# Patient Record
Sex: Female | Born: 1980 | Race: White | Hispanic: No | Marital: Single | Smoking: Never smoker
Health system: Southern US, Community
[De-identification: ages and names within clinical notes are randomized; demographics above are authoritative.]

## PROBLEM LIST (undated history)

## (undated) DIAGNOSIS — I341 Nonrheumatic mitral (valve) prolapse: Secondary | ICD-10-CM

## (undated) HISTORY — PX: TUBAL LIGATION: SHX77

## (undated) HISTORY — PX: BREAST SURGERY: SHX581

## (undated) HISTORY — PX: BREAST LUMPECTOMY: SHX2

---

## 2012-05-26 ENCOUNTER — Emergency Department: Payer: Self-pay | Admitting: Emergency Medicine

## 2012-05-26 LAB — WET PREP, GENITAL

## 2012-05-26 LAB — URINALYSIS, COMPLETE
Bilirubin,UR: NEGATIVE
Leukocyte Esterase: NEGATIVE
Ph: 5 (ref 4.5–8.0)
Protein: 30
RBC,UR: 11 /HPF (ref 0–5)
Squamous Epithelial: 3

## 2012-05-26 LAB — COMPREHENSIVE METABOLIC PANEL
Albumin: 4.1 g/dL (ref 3.4–5.0)
Alkaline Phosphatase: 81 U/L (ref 50–136)
Anion Gap: 7 (ref 7–16)
BUN: 11 mg/dL (ref 7–18)
Calcium, Total: 9.3 mg/dL (ref 8.5–10.1)
Creatinine: 0.89 mg/dL (ref 0.60–1.30)
EGFR (Non-African Amer.): >60
Glucose: 119 mg/dL — ABNORMAL HIGH (ref 65–99)
Osmolality: 274 (ref 275–301)
SGOT(AST): 13 U/L — ABNORMAL LOW (ref 15–37)
Sodium: 137 mmol/L (ref 136–145)
Total Protein: 8 g/dL (ref 6.4–8.2)

## 2012-05-26 LAB — CBC
HCT: 35.8 % (ref 35.0–47.0)
HGB: 12.4 g/dL (ref 12.0–16.0)
RBC: 4.3 10*6/uL (ref 3.80–5.20)
RDW: 13.7 % (ref 11.5–14.5)
WBC: 9.5 10*3/uL (ref 3.6–11.0)

## 2016-06-06 ENCOUNTER — Emergency Department (HOSPITAL_BASED_OUTPATIENT_CLINIC_OR_DEPARTMENT_OTHER): Payer: Medicaid Other

## 2016-06-06 ENCOUNTER — Emergency Department (HOSPITAL_BASED_OUTPATIENT_CLINIC_OR_DEPARTMENT_OTHER)
Admission: EM | Admit: 2016-06-06 | Discharge: 2016-06-06 | Disposition: A | Discharge status: Home / Self Care | Payer: Medicaid Other | Attending: Emergency Medicine | Admitting: Emergency Medicine

## 2016-06-06 ENCOUNTER — Encounter (HOSPITAL_BASED_OUTPATIENT_CLINIC_OR_DEPARTMENT_OTHER): Payer: Self-pay | Admitting: *Deleted

## 2016-06-06 DIAGNOSIS — Z791 Long term (current) use of non-steroidal anti-inflammatories (NSAID): Secondary | ICD-10-CM | POA: Diagnosis not present

## 2016-06-06 DIAGNOSIS — M5126 Other intervertebral disc displacement, lumbar region: Secondary | ICD-10-CM

## 2016-06-06 DIAGNOSIS — M5106 Intervertebral disc disorders with myelopathy, lumbar region: Secondary | ICD-10-CM | POA: Diagnosis not present

## 2016-06-06 DIAGNOSIS — R200 Anesthesia of skin: Secondary | ICD-10-CM

## 2016-06-06 DIAGNOSIS — M549 Dorsalgia, unspecified: Secondary | ICD-10-CM | POA: Diagnosis present

## 2016-06-06 HISTORY — DX: Nonrheumatic mitral (valve) prolapse: I34.1

## 2016-06-06 MED ORDER — PREDNISONE 20 MG PO TABS
40.0000 mg | ORAL_TABLET | Freq: Once | ORAL | Status: AC
  Administered 2016-06-06: 40 mg via ORAL
  Filled 2016-06-06: qty 2

## 2016-06-06 MED ORDER — METHYLPREDNISOLONE 4 MG PO TBPK: ORAL_TABLET | ORAL | 0 refills | Status: DC

## 2016-06-06 NOTE — ED Notes (Signed)
Pt back from MRI 

## 2016-06-06 NOTE — Discharge Instructions (Signed)
Please call the neurosurgery clinic listed on Monday morning to schedule a follow-up appointment. Return to ER for bowel or bladder incontinence, fever, numbness/tingling to your groin/buttocks region, new or worsening symptoms, any additional concerns.

## 2016-06-06 NOTE — ED Triage Notes (Signed)
Pt c/o lower right back pain which radiates downn right leg with right foot numbness x 2 months ago

## 2016-06-06 NOTE — ED Provider Notes (Signed)
[facility]   CSN: [id number] Arrival date & time: 06/06/16  1149     History   Chief Complaint Chief Complaint  Patient presents with  . Back Pain    HPI Anna Ware is a 35 y.o. female.  The history is provided by the patient and medical records. No language interpreter was used.  Back Pain   Associated symptoms include numbness. Pertinent negatives include no fever, no abdominal pain and no dysuria.   Anna Ware is a 35 y.o. female  with a PMH of MVP who presents to the Emergency Department complaining of worsening right buttocks pain which radiates down her leg to the tips of her toes x 1.5 months. Pain is described as a charley horse sensation to her buttocks and a burning pain down her leg. Right leg will intermittently feel weak or "give out". No medications taken prior to arrival for symptoms. Patient is a Nature conservation officerstocker at [employer] and notices that heavy lifting and working on her feet makes pain worse. Denies upper back or neck pain. Denies fever, saddle anesthesia, urinary complaints including retention/incontinence. No history of cancer, IVDU, or recent spinal procedures.   Past Medical History:  Diagnosis Date  . Mitral valve prolapse     There are no active problems to display for this patient.   Past Surgical History:  Procedure Laterality Date  . BREAST LUMPECTOMY    . BREAST SURGERY    . CESAREAN SECTION    . TUBAL LIGATION      OB History    No data available       Home Medications    Prior to Admission medications   Medication Sig Start Date End Date Taking? Authorizing Provider  ibuprofen (ADVIL,MOTRIN) 600 MG tablet Take 600 mg by mouth every 6 (six) hours as needed.   Yes Historical Provider, MD  methylPREDNISolone (MEDROL DOSEPAK) 4 MG TBPK tablet Take as directed. 06/06/16   Chase PicketJaime Pilcher Ninetta Adelstein, PA-C    Family History No family history on file.  Social History Social History  Substance Use Topics  .  Smoking status: Never Smoker  . Smokeless tobacco: Not on file  . Alcohol use No     Allergies   Review of patient's allergies indicates no known allergies.   Review of Systems Review of Systems  Constitutional: Negative for fever.  HENT: Negative for congestion.   Eyes: Negative for visual disturbance.  Respiratory: Negative for cough and shortness of breath.   Cardiovascular: Negative.   Gastrointestinal: Negative for abdominal pain, nausea and vomiting.  Genitourinary: Negative for dysuria.  Musculoskeletal: Positive for arthralgias and myalgias. Negative for neck pain.  Skin: Negative for color change, rash and wound.  Neurological: Positive for numbness.     Physical Exam Updated Vital Signs BP 140/82 (BP Location: Right Arm)   Pulse 68   Temp 98.2 F (36.8 C) (Oral)   Resp 18   Ht 5\' 7"  (1.702 m)   Wt 101.6 kg   LMP 05/29/2016   SpO2 100%   BMI 35.08 kg/m   Physical Exam  Constitutional: She is oriented to person, place, and time. She appears well-developed and well-nourished.  Neck:  Full ROM without pain. No midline or paraspinal tenderness.  Cardiovascular: Normal rate, regular rhythm, normal heart sounds and intact distal pulses.   No murmur heard. Pulmonary/Chest: Effort normal and breath sounds normal. No respiratory distress. She has no wheezes.  Abdominal: Soft. She exhibits no distension. There is no tenderness.  Musculoskeletal:  No midline tenderness to palpation.  Straight leg raises are positive on right for radicular symptoms. 5/5 muscle strength of bilateral LE's. Able to ambulate and perform toe and heel raises.  Neurological: She is alert and oriented to person, place, and time. She has normal reflexes.  Decreased sensation of RLE in S1 nerve distribution.   Skin: Skin is warm and dry. No rash noted. No erythema.  Nursing note and vitals reviewed.    ED Treatments / Results  Labs (all labs ordered are listed, but only abnormal results  are displayed) Labs Reviewed - No data to display  EKG  EKG Interpretation None       Radiology Mr Lumbar Spine Wo Contrast  Result Date: 06/06/2016 CLINICAL DATA:  Right leg numbness. EXAM: MRI LUMBAR SPINE WITHOUT CONTRAST TECHNIQUE: Multiplanar, multisequence MR imaging of the lumbar spine was performed. No intravenous contrast was administered. COMPARISON:  None. FINDINGS: Segmentation:  Standard. Alignment:  Slight retrolisthesis at L5-S1. Vertebrae: No fracture, evidence of discitis, or bone lesion. Mild degenerative fatty marrow conversion around the L5-S1 disc and the T12 superior endplate Schmorl's node Conus medullaris: Extends to the L1 level and appears normal. Paraspinal and other soft tissues: Negative Disc levels: L4-5: Mild left foraminal disc bulging and endplate ridging but no compression. L5-S1: Disc narrowing and desiccation with right paracentral protrusion compressing the S1 nerve. IMPRESSION: L5-S1 right paracentral disc protrusion with S1 compression. Electronically Signed   By: Marnee Spring M.D.   On: 06/06/2016 15:10    Procedures Procedures (including critical care time)  Medications Ordered in ED Medications  predniSONE (DELTASONE) tablet 40 mg (40 mg Oral Given 06/06/16 1308)     Initial Impression / Assessment and Plan / ED Course  I have reviewed the triage vital signs and the nursing notes.  Pertinent labs & imaging results that were available during my care of the patient were reviewed by me and considered in my medical decision making (see chart for details).  Clinical Course   Anna Ware is a 35 y.o. female who presents to ED for Right buttock pain which radiates down her leg. Symptoms consistent with nerve compression. On exam, she does exhibit decreased sensation to the right lower extremity along the S1 nerve distribution pattern. No fevers or other infectious symptoms to suggest that the patient's back pain is due to an infection. No  saddle anesthesia or bowel/bladder incontinence. Patient does not have primary care provider. Given lack of PCP follow-up along with new onset of decreased S1 sensation, radicular pain, positive right leg straight leg raise, MRI obtained in the ED today which shows L5-S1 right paracentral disc protrusion with S1 compression. Discussed findings with patient as well as neurosurgery follow-up recommendations and home care instructions. Reasons to return to ED discussed and all questions answered.   Final Clinical Impressions(s) / ED Diagnoses   Final diagnoses:  Right leg numbness  Protruded lumbar disc    New Prescriptions New Prescriptions   METHYLPREDNISOLONE (MEDROL DOSEPAK) 4 MG TBPK TABLET    Take as directed.     [facility] Bonser, PA-C 06/06/16 1552    Vanetta Mulders, MD 06/07/16 [phone]

## 2016-06-06 NOTE — ED Notes (Signed)
Pt in radiology 

## 2016-07-12 ENCOUNTER — Encounter (HOSPITAL_BASED_OUTPATIENT_CLINIC_OR_DEPARTMENT_OTHER): Payer: Self-pay | Admitting: *Deleted

## 2016-07-12 ENCOUNTER — Emergency Department (HOSPITAL_BASED_OUTPATIENT_CLINIC_OR_DEPARTMENT_OTHER)
Admission: EM | Admit: 2016-07-12 | Discharge: 2016-07-12 | Disposition: A | Discharge status: Home / Self Care | Payer: Medicaid Other | Attending: Emergency Medicine | Admitting: Emergency Medicine

## 2016-07-12 DIAGNOSIS — M5417 Radiculopathy, lumbosacral region: Secondary | ICD-10-CM

## 2016-07-12 DIAGNOSIS — M5126 Other intervertebral disc displacement, lumbar region: Secondary | ICD-10-CM | POA: Insufficient documentation

## 2016-07-12 DIAGNOSIS — M545 Low back pain: Secondary | ICD-10-CM | POA: Diagnosis present

## 2016-07-12 MED ORDER — HYDROMORPHONE HCL 1 MG/ML IJ SOLN
2.0000 mg | Freq: Once | INTRAMUSCULAR | Status: AC
  Administered 2016-07-12: 2 mg via INTRAMUSCULAR
  Filled 2016-07-12: qty 2

## 2016-07-12 MED ORDER — OXYCODONE-ACETAMINOPHEN 5-325 MG PO TABS: 1.0000 | ORAL_TABLET | ORAL | 0 refills | Status: DC | PRN

## 2016-07-12 MED ORDER — ONDANSETRON 8 MG PO TBDP
8.0000 mg | ORAL_TABLET | Freq: Once | ORAL | Status: AC
  Administered 2016-07-12: 8 mg via ORAL
  Filled 2016-07-12: qty 1

## 2016-07-12 NOTE — ED Provider Notes (Addendum)
[facility] Provider Note: Anna DellJ. Lane Anabelle Bungert, MD, FACEP  CSN: [id number] MRN: [id number] ARRIVAL: 07/12/16 at 0458 ROOM: [facility]10/[facility]10   CHIEF COMPLAINT  Leg Pain   HISTORY OF PRESENT ILLNESS  Anna Ware is a 35 y.o. female right-sided sciatic pain and paresthesias in the S1 distribution for over 2 months. She was seen on October 14 and had an MRI which showed a herniated L5/S1 disc with compression of S1. She was not able to follow-up with neurosurgery due to financial limitations. She returns with an exacerbation of her pain for the past 5 days. It is still localized to the right S1 nerve distribution, worse with movement. The pain varies from sharp to burning. She also has paresthesias in the S1 distribution which come and go. She is not presently having paresthesias. She denies any change in bowel or bladder function. She is able to bear weight but it is painful to ambulate. She describes it as severe and limits her ability to perform activities of daily living.    Past Medical History:  Diagnosis Date  . Mitral valve prolapse     Past Surgical History:  Procedure Laterality Date  . BREAST LUMPECTOMY    . BREAST SURGERY    . CESAREAN SECTION    . TUBAL LIGATION      No family history on file.  Social History  Substance Use Topics  . Smoking status: Never Smoker  . Smokeless tobacco: Never Used  . Alcohol use No    Prior to Admission medications   Not on File    Allergies Patient has no known allergies.   REVIEW OF SYSTEMS  Negative except as noted here or in the History of Present Illness.   PHYSICAL EXAMINATION  Initial Vital Signs Blood pressure 136/96, pulse 109, temperature 98.3 F (36.8 C), resp. rate 20, height 5\' 7"  (1.702 m), weight 220 lb (99.8 kg), last menstrual period 06/20/2016, SpO2 99 %.  Examination General: Well-developed, well-nourished female in no acute distress; appearance consistent with age of record HENT: normocephalic;  atraumatic Eyes: Normal appearance Neck: supple Heart: regular rate and rhythm Lungs: clear to auscultation bilaterally Abdomen: soft; nondistended; nontender Back: Tender right sciatic notch with pain on attempted movement of the right hip Extremities: No deformity; limited range of motion of right hip due to pain; pulses normal Neurologic: Awake, alert and oriented; motor function grossly intact in all extremities but examination is limited in the lower extremities due to patient's pain and reduced range of motion; sensation intact in lower extremities and symmetric; no facial droop Skin: Warm and dry Psychiatric: Normal mood and affect   RESULTS  Summary of this visit's results, reviewed by myself:   EKG Interpretation  Date/Time:    Ventricular Rate:    PR Interval:    QRS Duration:   QT Interval:    QTC Calculation:   R Axis:     Text Interpretation:        Laboratory Studies: No results found for this or any previous visit (from the past 24 hour(s)). Imaging Studies: No results found.  ED COURSE  Nursing notes and initial vitals signs, including pulse oximetry, reviewed.  Vitals:   07/12/16 0514 07/12/16 0515  BP:  136/96  Pulse:  109  Resp:  20  Temp:  98.3 F (36.8 C)  SpO2:  99%  Weight: 220 lb (99.8 kg)   Height: 5\' 7"  (1.702 m)    6:01 AM We will re-refer the patient to [facility].  She was advised to remind them of their obligations under EMTALA to see her without demanding payment upfront. We will also refer to Dr. Pearletha ForgeHudnall of sports medicine.  PROCEDURES    ED DIAGNOSES     ICD-9-CM ICD-10-CM   1. Lumbosacral radiculopathy at S1 724.4 M54.17   2. Lumbar herniated disc 722.10 M51.26        Paula LibraJohn Daquavion Catala, MD 07/12/16 [id number]    Paula LibraJohn Daimian Sudberry, MD 07/12/16 2244

## 2016-07-12 NOTE — ED Triage Notes (Signed)
Pt states that she was seen in the ED in Oct for a herniated disc. Was not  able to follow up with a neuro MD due to cost. States pain has been ongoing since Oct. States last 5 days have been worse. Pain is located in her right leg. Describes as a sharp and feels like she is "being shocked" in her leg. States pain is constant and pt is able to walk but painful. Able to control her bladder and bowels. C/o numbness and tingling to her right leg.

## 2016-07-15 ENCOUNTER — Ambulatory Visit (INDEPENDENT_AMBULATORY_CARE_PROVIDER_SITE_OTHER): Payer: Self-pay | Admitting: Family Medicine

## 2016-07-15 ENCOUNTER — Encounter: Payer: Self-pay | Admitting: Family Medicine

## 2016-07-15 DIAGNOSIS — M5416 Radiculopathy, lumbar region: Secondary | ICD-10-CM

## 2016-07-15 MED ORDER — OXYCODONE-ACETAMINOPHEN 7.5-325 MG PO TABS: 1.0000 | ORAL_TABLET | Freq: Four times a day (QID) | ORAL | 0 refills | Status: DC | PRN

## 2016-07-15 MED ORDER — PREDNISONE 10 MG PO TABS: ORAL_TABLET | ORAL | 0 refills | Status: DC

## 2016-07-15 MED ORDER — CYCLOBENZAPRINE HCL 10 MG PO TABS: 10.0000 mg | ORAL_TABLET | Freq: Every evening | ORAL | 1 refills | Status: DC | PRN

## 2016-07-15 NOTE — Patient Instructions (Signed)
You have lumbar radiculopathy (a pinched nerve in your low back). A prednisone dose pack is the best option for immediate relief and may be prescribed. Day after finishing prednisone start aleve 2 tabs twice a day with food for pain and inflammation. Percocet as needed for severe pain (no driving on this medicine). Flexeril as needed for muscle spasms (no driving on this medicine if it makes you sleepy). Strengthening of low back muscles, abdominal musculature are key for long term pain relief. Call us when medicaid goes through so we can go ahead with injections and/or neurosurgery referral.

## 2016-07-20 ENCOUNTER — Telehealth: Payer: Self-pay | Admitting: Family Medicine

## 2016-07-20 DIAGNOSIS — M5416 Radiculopathy, lumbar region: Secondary | ICD-10-CM | POA: Insufficient documentation

## 2016-07-20 NOTE — Telephone Encounter (Signed)
It's possible the prednisone is causing the increased swelling but that is not dangerous.  Recommendation is to elevate, use ACE wrap for compression, icing if needed.

## 2016-07-20 NOTE — Progress Notes (Signed)
PCP: No PCP Per Patient  Subjective:   HPI: Patient is a 35 y.o. female here for right leg/hip pain.  Patient reports she's had 5 months of right sided leg pain from posterior hip down into toes. Associated with numbness in lateral two toes. Has tried percocet, medrol dose pack. Pain is 10/10 sharp. No known injury or trauma. No skin changes. No bowel/bladder dysfunction.  Past Medical History:  Diagnosis Date  . Mitral valve prolapse     No current outpatient prescriptions on file prior to visit.   No current facility-administered medications on file prior to visit.     Past Surgical History:  Procedure Laterality Date  . BREAST LUMPECTOMY    . BREAST SURGERY    . CESAREAN SECTION    . TUBAL LIGATION      No Known Allergies  Social History   Social History  . Marital status: Single    Spouse name: N/A  . Number of children: N/A  . Years of education: N/A   Occupational History  . Not on file.   Social History Main Topics  . Smoking status: Never Smoker  . Smokeless tobacco: Never Used  . Alcohol use No  . Drug use: Unknown  . Sexual activity: No   Other Topics Concern  . Not on file   Social History Narrative  . No narrative on file    No family history on file.  BP 125/78   Pulse 80   Ht 5\' 7"  (1.702 m)   Wt 220 lb (99.8 kg)   LMP 06/20/2016 (Approximate)   BMI 34.46 kg/m   Review of Systems: See HPI above.     Objective:  Physical Exam:  Gen: NAD, comfortable in exam room  Back: No gross deformity, scoliosis. No paraspinal TTP.  No midline or bony TTP. FROM with pain on flexion. Strength LEs 5/5 all muscle groups.   2+ MSRs in patellar and achilles tendons, equal bilaterally. Negative SLRs. Sensation intact to light touch bilaterally. Negative logroll bilateral hips   Assessment & Plan:  1. Lumbar radiculopathy - with radiation into right leg and 4th, 5th digit numbness.  She will try extended prednisone dose pack.   Unfortunately she is waiting on Medicaid as next step would be to go ahead with imaging of lumbar spine, consider ESIs vs neurosurgery referral.  Percocet as needed for severe pain.  Call us when Medicaid goes through.

## 2016-07-20 NOTE — Assessment & Plan Note (Signed)
with radiation into right leg and 4th, 5th digit numbness.  She will try extended prednisone dose pack.  Unfortunately she is waiting on Medicaid as next step would be to go ahead with imaging of lumbar spine, consider ESIs vs neurosurgery referral.  Percocet as needed for severe pain.  Call us when Medicaid goes through.

## 2016-07-21 NOTE — Telephone Encounter (Signed)
Spoke to patient and gave her information provided by physician. 

## 2017-02-28 ENCOUNTER — Encounter (HOSPITAL_BASED_OUTPATIENT_CLINIC_OR_DEPARTMENT_OTHER): Payer: Self-pay | Admitting: Emergency Medicine

## 2017-02-28 ENCOUNTER — Emergency Department (HOSPITAL_BASED_OUTPATIENT_CLINIC_OR_DEPARTMENT_OTHER)
Admission: EM | Admit: 2017-02-28 | Discharge: 2017-02-28 | Disposition: A | Discharge status: Home / Self Care | Payer: Medicaid Other | Attending: Emergency Medicine | Admitting: Emergency Medicine

## 2017-02-28 DIAGNOSIS — M545 Low back pain: Secondary | ICD-10-CM | POA: Diagnosis present

## 2017-02-28 DIAGNOSIS — M5431 Sciatica, right side: Secondary | ICD-10-CM

## 2017-02-28 DIAGNOSIS — M5416 Radiculopathy, lumbar region: Secondary | ICD-10-CM | POA: Diagnosis not present

## 2017-02-28 MED ORDER — KETOROLAC TROMETHAMINE 60 MG/2ML IM SOLN
60.0000 mg | Freq: Once | INTRAMUSCULAR | Status: AC
  Administered 2017-02-28: 60 mg via INTRAMUSCULAR
  Filled 2017-02-28: qty 2

## 2017-02-28 MED ORDER — OXYCODONE-ACETAMINOPHEN 5-325 MG PO TABS: 1.0000 | ORAL_TABLET | Freq: Four times a day (QID) | ORAL | 0 refills | Status: DC | PRN

## 2017-02-28 MED ORDER — PREDNISONE 10 MG (21) PO TBPK: ORAL_TABLET | Freq: Every day | ORAL | 0 refills | Status: DC

## 2017-02-28 MED ORDER — METHOCARBAMOL 500 MG PO TABS: 500.0000 mg | ORAL_TABLET | Freq: Two times a day (BID) | ORAL | 0 refills | Status: DC

## 2017-02-28 MED ORDER — NAPROXEN 500 MG PO TABS: 500.0000 mg | ORAL_TABLET | Freq: Two times a day (BID) | ORAL | 0 refills | Status: DC

## 2017-02-28 MED ORDER — HYDROMORPHONE HCL 1 MG/ML IJ SOLN
2.0000 mg | Freq: Once | INTRAMUSCULAR | Status: AC
  Administered 2017-02-28: 2 mg via INTRAMUSCULAR
  Filled 2017-02-28: qty 2

## 2017-02-28 NOTE — Discharge Instructions (Signed)
Return to the ED with any concerns including weakness of legs, not able to urinate, loss of control of bowel or bladder, decreased level of alertness/lethargy, or any other alarming symptoms °

## 2017-02-28 NOTE — ED Provider Notes (Signed)
[facility]   CSN: [id number] Arrival date & time: 02/28/17  0915     History   Chief Complaint Chief Complaint  Patient presents with  . Back Pain    HPI Anna Ware is a 36 y.o. female.  HPI  Pt presenting with c/o right sided low back pain.  She has hx of sciatica and herinated disc.  She states that after treatment with prednisone for last flare-up her symptoms have not bothered her much, she has not yet seen a neurosurgeon.  Current pain in right low back has been presenting for the past week- radiates down past right knee.  No weakness of leg, no urinary retention, no loss of control or bowel or bladder.  No fever/chills.  She has not had any injury.  There are no other associated systemic symptoms, there are no other alleviating or modifying factors.   Past Medical History:  Diagnosis Date  . Mitral valve prolapse     Patient Active Problem List   Diagnosis Date Noted  . Lumbar radiculopathy 07/20/2016    Past Surgical History:  Procedure Laterality Date  . BREAST LUMPECTOMY    . BREAST SURGERY    . CESAREAN SECTION    . TUBAL LIGATION      OB History    No data available       Home Medications    Prior to Admission medications   Medication Sig Start Date End Date Taking? Authorizing Provider  cyclobenzaprine (FLEXERIL) 10 MG tablet Take 1 tablet (10 mg total) by mouth at bedtime as needed for muscle spasms. 07/15/16   Hudnall, Azucena Fallen, MD  methocarbamol (ROBAXIN) 500 MG tablet Take 1 tablet (500 mg total) by mouth 2 (two) times daily. 02/28/17   Jerelyn Scott, MD  naproxen (NAPROSYN) 500 MG tablet Take 1 tablet (500 mg total) by mouth 2 (two) times daily. 02/28/17   Jerelyn Scott, MD  oxyCODONE-acetaminophen (PERCOCET/ROXICET) 5-325 MG tablet Take 1-2 tablets by mouth every 6 (six) hours as needed for severe pain. 02/28/17   Jerelyn Scott, MD  predniSONE (STERAPRED UNI-PAK 21 TAB) 10 MG (21) TBPK tablet Take by mouth daily. Take  6 tabs by mouth daily  for 2 days, then 5 tabs for 2 days, then 4 tabs for 2 days, then 3 tabs for 2 days, 2 tabs for 2 days, then 1 tab by mouth daily for 2 days 02/28/17   Jerelyn Scott, MD    Family History History reviewed. No pertinent family history.  Social History Social History  Substance Use Topics  . Smoking status: Never Smoker  . Smokeless tobacco: Never Used  . Alcohol use No     Allergies   Patient has no known allergies.   Review of Systems Review of Systems  ROS reviewed and all otherwise negative except for mentioned in HPI   Physical Exam Updated Vital Signs BP 117/74 (BP Location: Right Arm)   Pulse 85   Temp 97.8 F (36.6 C) (Oral)   Resp 18   Ht 5\' 7"  (1.702 m)   Wt 99.8 kg (220 lb)   LMP 02/10/2017   SpO2 100%   BMI 34.46 kg/m  Vitals reviewed Physical Exam Physical Examination: General appearance - alert, well appearing, and in no distress Mental status - alert, oriented to person, place, and time Eyes - no conjunctival injection, no scleral icterus Chest - clear to auscultation, no wheezes, rales or rhonchi, symmetric air entry Heart - normal rate, regular rhythm,  normal S1, S2, no murmurs, rubs, clicks or gallops Back exam - no midline tenderness to palpation midline spine, right lower paraspinal tenderness to palpation, no CVA tenderness Neurological - alert, oriented, normal speech, strength 5/5 in lower extrmeties- movement limited by pain, sensation intact, normal DTRs of knee and ankle Musculoskeletal - no joint tenderness, deformity or swelling Extremities - peripheral pulses normal, no pedal edema, no clubbing or cyanosis Skin - normal coloration and turgor, no rashes  ED Treatments / Results  Labs (all labs ordered are listed, but only abnormal results are displayed) Labs Reviewed - No data to display  EKG  EKG Interpretation None       Radiology No results found.  Procedures Procedures (including critical care  time)  Medications Ordered in ED Medications  HYDROmorphone (DILAUDID) injection 2 mg (2 mg Intramuscular Given 02/28/17 0959)  ketorolac (TORADOL) injection 60 mg (60 mg Intramuscular Given 02/28/17 0958)     Initial Impression / Assessment and Plan / ED Course  I have reviewed the triage vital signs and the nursing notes.  Pertinent labs & imaging results that were available during my care of the patient were reviewed by me and considered in my medical decision making (see chart for details).    11:07 AM pt feeling much improved after meds.    Pt presenting with c/o right sided low back pain with raidation down right leg.  No injury, no midline pain.  symtoms are most c/w sciatica due to herniated disc.  Pt feels much improved after meds in the ED.  Pt discharge with po meds, advised f/u with neurosurgery.  Discharged with strict return precautions.  Pt agreeable with plan.  Final Clinical Impressions(s) / ED Diagnoses   Final diagnoses:  Sciatica, right side    New Prescriptions Discharge Medication List as of 02/28/2017 11:12 AM    START taking these medications   Details  methocarbamol (ROBAXIN) 500 MG tablet Take 1 tablet (500 mg total) by mouth 2 (two) times daily., Starting Sun 02/28/2017, Print    naproxen (NAPROSYN) 500 MG tablet Take 1 tablet (500 mg total) by mouth 2 (two) times daily., Starting Sun 02/28/2017, Print    oxyCODONE-acetaminophen (PERCOCET/ROXICET) 5-325 MG tablet Take 1-2 tablets by mouth every 6 (six) hours as needed for severe pain., Starting Sun 02/28/2017, Print    predniSONE (STERAPRED UNI-PAK 21 TAB) 10 MG (21) TBPK tablet Take by mouth daily. Take 6 tabs by mouth daily  for 2 days, then 5 tabs for 2 days, then 4 tabs for 2 days, then 3 tabs for 2 days, 2 tabs for 2 days, then 1 tab by mouth daily for 2 days, Starting Sun 02/28/2017, Print         Jerelyn ScottLinker, Rhetta Cleek, MD 03/04/17 [phone]

## 2017-02-28 NOTE — ED Triage Notes (Signed)
Patient states that about a year ago the she was found to have a herniated disk. Was treated by us, and then Dr. Herbert MoorsHudnal. The patient reports that she has been fine for the last year and then last week she started to have the pain again and has been in bed x 1 week with the pain.

## 2017-03-05 ENCOUNTER — Ambulatory Visit (INDEPENDENT_AMBULATORY_CARE_PROVIDER_SITE_OTHER): Payer: Medicaid Other | Admitting: Family Medicine

## 2017-03-05 ENCOUNTER — Encounter: Payer: Self-pay | Admitting: Family Medicine

## 2017-03-05 DIAGNOSIS — M5416 Radiculopathy, lumbar region: Secondary | ICD-10-CM | POA: Diagnosis present

## 2017-03-05 MED ORDER — OXYCODONE-ACETAMINOPHEN 5-325 MG PO TABS: 1.0000 | ORAL_TABLET | Freq: Four times a day (QID) | ORAL | 0 refills | Status: DC | PRN

## 2017-03-05 NOTE — Assessment & Plan Note (Signed)
with radiation into right leg and lateral lower leg numbness.  She will continue prednisone dose pack.  Robaxin or flexeril as needed for spasms.  Percocet for severe pain.  She will call us in a week to let us know how she's doing.  Consider repeating MRI or ESI if not improving as expected.

## 2017-03-05 NOTE — Progress Notes (Signed)
PCP: Patient, No Pcp Per  Subjective:   HPI: Patient is a 36 y.o. female here for right leg/hip pain.  07/15/16: Patient reports she's had 5 months of right sided leg pain from posterior hip down into toes. Associated with numbness in lateral two toes. Has tried percocet, medrol dose pack. Pain is 10/10 sharp. No known injury or trauma. No skin changes. No bowel/bladder dysfunction.  03/05/17: Patient reports she had been doing well until about 2 weeks ago. No acute injury or trauma. Just started to get pain posterior right low back and buttock. Progressed to include right leg into calf and ankle. Feels like a tightness down there but overall leg feels like it's on fire with burning sensation and numbness. Tried prednisone dose pack, robaxin, naproxen, percocet. Prednisone has helped some and percocet helps with pain. Still with 7/10 level pain, sharp. Worse in all motions except lying down on stomach. No bowel/bladder dysfunction. No skin changes.  Past Medical History:  Diagnosis Date  . Mitral valve prolapse     Current Outpatient Prescriptions on File Prior to Visit  Medication Sig Dispense Refill  . cyclobenzaprine (FLEXERIL) 10 MG tablet Take 1 tablet (10 mg total) by mouth at bedtime as needed for muscle spasms. 60 tablet 1  . methocarbamol (ROBAXIN) 500 MG tablet Take 1 tablet (500 mg total) by mouth 2 (two) times daily. 20 tablet 0  . naproxen (NAPROSYN) 500 MG tablet Take 1 tablet (500 mg total) by mouth 2 (two) times daily. 30 tablet 0  . predniSONE (STERAPRED UNI-PAK 21 TAB) 10 MG (21) TBPK tablet Take by mouth daily. Take 6 tabs by mouth daily  for 2 days, then 5 tabs for 2 days, then 4 tabs for 2 days, then 3 tabs for 2 days, 2 tabs for 2 days, then 1 tab by mouth daily for 2 days 42 tablet 0   No current facility-administered medications on file prior to visit.     Past Surgical History:  Procedure Laterality Date  . BREAST LUMPECTOMY    . BREAST SURGERY     . CESAREAN SECTION    . TUBAL LIGATION      No Known Allergies  Social History   Social History  . Marital status: Single    Spouse name: N/A  . Number of children: N/A  . Years of education: N/A   Occupational History  . Not on file.   Social History Main Topics  . Smoking status: Never Smoker  . Smokeless tobacco: Never Used  . Alcohol use No  . Drug use: Unknown  . Sexual activity: No   Other Topics Concern  . Not on file   Social History Narrative  . No narrative on file    No family history on file.  BP 111/75   Pulse 67   Ht 5\' 7"  (1.702 m)   Wt 220 lb (99.8 kg)   LMP 02/10/2017   BMI 34.46 kg/m   Review of Systems: See HPI above.     Objective:  Physical Exam:  Gen: NAD, comfortable in exam room  Back: No gross deformity, scoliosis. No paraspinal TTP.  No midline or bony TTP. Flexion to 45 degrees, painful.  No pain other motions. Strength LEs 5/5 all muscle groups.   2+ MSRs in patellar and achilles tendons, equal bilaterally. Positive right SLR, negative left. Sensation diminished left lower leg laterally, otherwise intact. Negative logroll bilateral hips   Assessment & Plan:  1. Lumbar radiculopathy - with radiation  into right leg and lateral lower leg numbness.  She will continue prednisone dose pack.  Robaxin or flexeril as needed for spasms.  Percocet for severe pain.  She will call us in a week to let us know how she's doing.  Consider repeating MRI or ESI if not improving as expected.

## 2017-03-05 NOTE — Patient Instructions (Addendum)
You have lumbar radiculopathy (a pinched nerve in your low back). Finish the prednisone as directed. Day after finishing prednisone you could then start the naproxen twice a day with food for pain and inflammation. Percocet as needed for severe pain (no driving on this medicine). Robaxin as needed for muscle spasms (no driving on this medicine if it makes you sleepy). Strengthening of low back muscles, abdominal musculature are key for long term pain relief. Consider repeating MRI or trial of an injection where you had the irritated nerve back in November if not improving. Call us next week to let us know how you're doing.

## 2017-04-15 ENCOUNTER — Encounter: Payer: Self-pay | Admitting: Family Medicine

## 2017-04-15 ENCOUNTER — Ambulatory Visit (INDEPENDENT_AMBULATORY_CARE_PROVIDER_SITE_OTHER): Payer: Medicaid Other | Admitting: Family Medicine

## 2017-04-15 DIAGNOSIS — M5416 Radiculopathy, lumbar region: Secondary | ICD-10-CM

## 2017-04-15 MED ORDER — PREDNISONE 10 MG (21) PO TBPK: ORAL_TABLET | ORAL | 0 refills | Status: DC

## 2017-04-15 NOTE — Assessment & Plan Note (Signed)
still with radiation into right leg and lateral lower leg numbness.  Only mild improvement with prednisone dose pack, naproxen, percocet.  Will go ahead with ESI on right at L5-S1 where she's had S1 nerve root compression consistent with her distribution.  In meantime will repeat 6 day prednisone dose pack.  Consider repeat injection, PT, neurosurgery referral based on how she does.

## 2017-04-15 NOTE — Patient Instructions (Addendum)
Take the prednisone for 6 days. Don't take the naproxen while you're on the prednisone. We will set you up for an epidural steroid injection of your back. Call me a week after the shot to let me know how you're doing. Consider repeat injections, physical therapy, neurosurgery referral depending on how that goes.

## 2017-04-15 NOTE — Progress Notes (Signed)
PCP: Patient, No Pcp Per  Subjective:   HPI: Patient is a 36 y.o. female here for right leg/hip pain.  07/15/16: Patient reports she's had 5 months of right sided leg pain from posterior hip down into toes. Associated with numbness in lateral two toes. Has tried percocet, medrol dose pack. Pain is 10/10 sharp. No known injury or trauma. No skin changes. No bowel/bladder dysfunction.  03/05/17: Patient reports she had been doing well until about 2 weeks ago. No acute injury or trauma. Just started to get pain posterior right low back and buttock. Progressed to include right leg into calf and ankle. Feels like a tightness down there but overall leg feels like it's on fire with burning sensation and numbness. Tried prednisone dose pack, robaxin, naproxen, percocet. Prednisone has helped some and percocet helps with pain. Still with 7/10 level pain, sharp. Worse in all motions except lying down on stomach. No bowel/bladder dysfunction. No skin changes.  8/23: Patient reports she had some improvement from last visit but incomplete. Pain level worse past 2 weeks, 5/10, sharp. Going down right leg into foot laterally. No new injury or trauma. Associated numbness.  No skin changes. No bowel/bladder dysfunction. Taking naproxen which helps.  Past Medical History:  Diagnosis Date  . Mitral valve prolapse     Current Outpatient Prescriptions on File Prior to Visit  Medication Sig Dispense Refill  . cyclobenzaprine (FLEXERIL) 10 MG tablet Take 1 tablet (10 mg total) by mouth at bedtime as needed for muscle spasms. 60 tablet 1  . methocarbamol (ROBAXIN) 500 MG tablet Take 1 tablet (500 mg total) by mouth 2 (two) times daily. 20 tablet 0  . naproxen (NAPROSYN) 500 MG tablet Take 1 tablet (500 mg total) by mouth 2 (two) times daily. 30 tablet 0  . oxyCODONE-acetaminophen (PERCOCET/ROXICET) 5-325 MG tablet Take 1 tablet by mouth every 6 (six) hours as needed for severe pain. 20 tablet  0   No current facility-administered medications on file prior to visit.     Past Surgical History:  Procedure Laterality Date  . BREAST LUMPECTOMY    . BREAST SURGERY    . CESAREAN SECTION    . TUBAL LIGATION      No Known Allergies  Social History   Social History  . Marital status: Single    Spouse name: N/A  . Number of children: N/A  . Years of education: N/A   Occupational History  . Not on file.   Social History Main Topics  . Smoking status: Never Smoker  . Smokeless tobacco: Never Used  . Alcohol use No  . Drug use: Unknown  . Sexual activity: No   Other Topics Concern  . Not on file   Social History Narrative  . No narrative on file    No family history on file.  BP 134/87   Pulse 69   Ht 5\' 7"  (1.702 m)   Wt 230 lb (104.3 kg)   BMI 36.02 kg/m   Review of Systems: See HPI above.     Objective:  Physical Exam:  Gen: NAD, comfortable in exam room  Back: No gross deformity, scoliosis. No paraspinal TTP.  No midline or bony TTP. Strength LEs 5/5 all muscle groups.   2+ MSRs in patellar and achilles tendons, equal bilaterally. Positive right SLR, negative left. Sensation diminished right lower leg laterally, otherwise intact. Negative logroll bilateral hips   Assessment & Plan:  1. Lumbar radiculopathy - still with radiation into right leg and  lateral lower leg numbness.  Only mild improvement with prednisone dose pack, naproxen, percocet.  Will go ahead with ESI on right at L5-S1 where she's had S1 nerve root compression consistent with her distribution.  In meantime will repeat 6 day prednisone dose pack.  Consider repeat injection, PT, neurosurgery referral based on how she does.

## 2017-04-19 ENCOUNTER — Other Ambulatory Visit: Payer: Self-pay | Admitting: Family Medicine

## 2017-04-19 DIAGNOSIS — M545 Low back pain: Principal | ICD-10-CM

## 2017-04-19 DIAGNOSIS — G8929 Other chronic pain: Secondary | ICD-10-CM

## 2017-04-29 ENCOUNTER — Inpatient Hospital Stay
Admission: RE | Admit: 2017-04-29 | Discharge: 2017-04-29 | Disposition: A | Discharge status: Home / Self Care | Payer: Medicaid Other | Source: Ambulatory Visit | Attending: Family Medicine | Admitting: Family Medicine

## 2017-04-29 NOTE — Discharge Instructions (Signed)

## 2017-05-07 ENCOUNTER — Inpatient Hospital Stay
Admission: RE | Admit: 2017-05-07 | Discharge: 2017-05-07 | Disposition: A | Discharge status: Home / Self Care | Payer: Self-pay | Source: Ambulatory Visit | Attending: Family Medicine | Admitting: Family Medicine

## 2017-05-07 NOTE — Discharge Instructions (Signed)

## 2017-10-27 ENCOUNTER — Ambulatory Visit (INDEPENDENT_AMBULATORY_CARE_PROVIDER_SITE_OTHER): Payer: Medicaid Other | Admitting: Family Medicine

## 2017-10-27 ENCOUNTER — Encounter: Payer: Self-pay | Admitting: Family Medicine

## 2017-10-27 DIAGNOSIS — M5416 Radiculopathy, lumbar region: Secondary | ICD-10-CM | POA: Diagnosis present

## 2017-10-27 MED ORDER — OXYCODONE-ACETAMINOPHEN 5-325 MG PO TABS: 1.0000 | ORAL_TABLET | Freq: Four times a day (QID) | ORAL | 0 refills | Status: AC | PRN

## 2017-10-27 MED ORDER — OXYCODONE-ACETAMINOPHEN 5-325 MG PO TABS: 1.0000 | ORAL_TABLET | Freq: Four times a day (QID) | ORAL | 0 refills | Status: DC | PRN

## 2017-10-27 MED ORDER — TIZANIDINE HCL 4 MG PO TABS: 4.0000 mg | ORAL_TABLET | Freq: Four times a day (QID) | ORAL | 1 refills | Status: AC | PRN

## 2017-10-27 MED ORDER — PREDNISONE 10 MG (21) PO TBPK: ORAL_TABLET | ORAL | 0 refills | Status: AC

## 2017-10-27 NOTE — Assessment & Plan Note (Signed)
radiation into right leg with lateral lower leg numbness.  Prior MRI with evidence S1 nerve root compression.  Will start prednisone dose pack with percocet, tizanidine as needed.  She will look into her insurance to see if she has Medicaid and not just family planning Medicaid and contact us - if she does, will go ahead with ESI for right S1 radiculopathy.  F/u in 1 week.  Out of work in meantime.

## 2017-10-27 NOTE — Progress Notes (Signed)
PCP: Patient, No Pcp Per  Subjective:   HPI: Patient is a 37 y.o. female here for right leg/hip pain.  07/15/16: Patient reports she's had 5 months of right sided leg pain from posterior hip down into toes. Associated with numbness in lateral two toes. Has tried percocet, medrol dose pack. Pain is 10/10 sharp. No known injury or trauma. No skin changes. No bowel/bladder dysfunction.  03/05/17: Patient reports she had been doing well until about 2 weeks ago. No acute injury or trauma. Just started to get pain posterior right low back and buttock. Progressed to include right leg into calf and ankle. Feels like a tightness down there but overall leg feels like it's on fire with burning sensation and numbness. Tried prednisone dose pack, robaxin, naproxen, percocet. Prednisone has helped some and percocet helps with pain. Still with 7/10 level pain, sharp. Worse in all motions except lying down on stomach. No bowel/bladder dysfunction. No skin changes.  8/23: Patient reports she had some improvement from last visit but incomplete. Pain level worse past 2 weeks, 5/10, sharp. Going down right leg into foot laterally. No new injury or trauma. Associated numbness.  No skin changes. No bowel/bladder dysfunction. Taking naproxen which helps.  10/27/17: Patient reports she was unable to get ESI done after last visit. She states pain started back badly about 4 days ago on right side of low back going into right leg to foot and 5th digit. Associated numbness in same distribution. Pain level 8/10, sharp, hard to get comfortable. Taking ibuprofen every 4 hours. Worse to sit then stand. No skin changes. No bowel/bladder dysfunction.  Past Medical History:  Diagnosis Date  . Mitral valve prolapse     No current outpatient medications on file prior to visit.   No current facility-administered medications on file prior to visit.     Past Surgical History:  Procedure Laterality  Date  . BREAST LUMPECTOMY    . BREAST SURGERY    . CESAREAN SECTION    . TUBAL LIGATION      No Known Allergies  Social History   Socioeconomic History  . Marital status: Single    Spouse name: Not on file  . Number of children: Not on file  . Years of education: Not on file  . Highest education level: Not on file  Social Needs  . Financial resource strain: Not on file  . Food insecurity - worry: Not on file  . Food insecurity - inability: Not on file  . Transportation needs - medical: Not on file  . Transportation needs - non-medical: Not on file  Occupational History  . Not on file  Tobacco Use  . Smoking status: Never Smoker  . Smokeless tobacco: Never Used  Substance and Sexual Activity  . Alcohol use: No  . Drug use: Not on file  . Sexual activity: No    Birth control/protection: None, Surgical  Other Topics Concern  . Not on file  Social History Narrative  . Not on file    History reviewed. No pertinent family history.  BP 120/75   Pulse 75   Ht 5\' 7"  (1.702 m)   Wt 225 lb (102.1 kg)   BMI 35.24 kg/m   Review of Systems: See HPI above.     Objective:  Physical Exam:  Gen: NAD, comfortable in exam room.  Back: No gross deformity, scoliosis. TTP right lumbar paraspinal region.  No midline or bony TTP. ROM limited to 0 degrees extension, 15 flexion. Strength  LEs 5/5 all muscle groups.   2+ MSRs in patellar and achilles tendons, equal bilaterally. Positive right SLR, negative left. Sensation intact to light touch bilaterally.  Bilateral hips: No deformity. FROM with 5/5 strength. No tenderness to palpation. NVI distally. Negative logrolls.   Assessment & Plan:  1. Lumbosacral radiculopathy - radiation into right leg with lateral lower leg numbness.  Prior MRI with evidence S1 nerve root compression.  Will start prednisone dose pack with percocet, tizanidine as needed.  She will look into her insurance to see if she has Medicaid and not just  family planning Medicaid and contact us - if she does, will go ahead with ESI for right S1 radiculopathy.  F/u in 1 week.  Out of work in meantime.

## 2017-10-27 NOTE — Patient Instructions (Signed)
You have lumbar radiculopathy (a pinched nerve in your low back). A prednisone dose pack is the best option for immediate relief and may be prescribed. Day after finishing prednisone start ibuprofen 800mg  three times a day with food for pain and inflammation. Percocet as needed for severe pain (no driving on this medicine). Tizanidine as needed for muscle spasms (no driving on this medicine if it makes you sleepy). Stay as active as possible. Physical therapy has been shown to be helpful as well. Strengthening of low back muscles, abdominal musculature are key for long term pain relief. Let me know what you find out about the insurance and if this gets cleared up, then we can refer you for an epidural steroid injection. Follow up with me in 1 week.

## 2017-11-02 ENCOUNTER — Ambulatory Visit (INDEPENDENT_AMBULATORY_CARE_PROVIDER_SITE_OTHER): Payer: Medicaid Other | Admitting: Family Medicine

## 2017-11-02 ENCOUNTER — Encounter: Payer: Self-pay | Admitting: Family Medicine

## 2017-11-02 DIAGNOSIS — M5416 Radiculopathy, lumbar region: Secondary | ICD-10-CM | POA: Diagnosis not present

## 2017-11-02 NOTE — Patient Instructions (Signed)
You have lumbar radiculopathy (a pinched nerve in your low back). Tizanidine only if needed at this point. Ibuprofen or aleve if needed for pain also. Do home exercises (pick 3-5 in the handout) and do daily for next 4-6 weeks. Call me if you want to do physical therapy. Follow up with me as needed.

## 2017-11-03 ENCOUNTER — Encounter: Payer: Self-pay | Admitting: Family Medicine

## 2017-11-03 NOTE — Assessment & Plan Note (Signed)
Prior MRI with evidence S1 nerve root compression, current pain consistent with this.  Much improved with prednisone dose pack, percocet, tizanidine.  To take ibuprofen or aleve if needed, tizanidine if needed now.  Reviewed home exercises to do daily.  F/u prn.

## 2017-11-03 NOTE — Progress Notes (Signed)
PCP: Patient, No Pcp Per  Subjective:   HPI: Patient is a 37 y.o. female here for right leg/hip pain.  07/15/16: Patient reports she's had 5 months of right sided leg pain from posterior hip down into toes. Associated with numbness in lateral two toes. Has tried percocet, medrol dose pack. Pain is 10/10 sharp. No known injury or trauma. No skin changes. No bowel/bladder dysfunction.  03/05/17: Patient reports she had been doing well until about 2 weeks ago. No acute injury or trauma. Just started to get pain posterior right low back and buttock. Progressed to include right leg into calf and ankle. Feels like a tightness down there but overall leg feels like it's on fire with burning sensation and numbness. Tried prednisone dose pack, robaxin, naproxen, percocet. Prednisone has helped some and percocet helps with pain. Still with 7/10 level pain, sharp. Worse in all motions except lying down on stomach. No bowel/bladder dysfunction. No skin changes.  8/23: Patient reports she had some improvement from last visit but incomplete. Pain level worse past 2 weeks, 5/10, sharp. Going down right leg into foot laterally. No new injury or trauma. Associated numbness.  No skin changes. No bowel/bladder dysfunction. Taking naproxen which helps.  10/27/17: Patient reports she was unable to get ESI done after last visit. She states pain started back badly about 4 days ago on right side of low back going into right leg to foot and 5th digit. Associated numbness in same distribution. Pain level 8/10, sharp, hard to get comfortable. Taking ibuprofen every 4 hours. Worse to sit then stand. No skin changes. No bowel/bladder dysfunction.  3/12: Patient reports that she is much better following prednisone, tizanidine with Percocet.   Pain level down to 1 out of 10 with stiffness first thing in the morning. States that it takes her about an hour to get going. Took about a couple days  following last visit with the prednisone before her pain was much better. No bowel or bladder dysfunction. No numbness or tingling.  Past Medical History:  Diagnosis Date  . Mitral valve prolapse     Current Outpatient Medications on File Prior to Visit  Medication Sig Dispense Refill  . oxyCODONE-acetaminophen (PERCOCET/ROXICET) 5-325 MG tablet Take 1 tablet by mouth every 6 (six) hours as needed for severe pain. 20 tablet 0  . predniSONE (STERAPRED UNI-PAK 21 TAB) 10 MG (21) TBPK tablet 6 tabs po day 1, 5 tabs po day 2, 4 tabs po day 3, 3 tabs po day 4, 2 tabs po day 5, 1 tab po day 6 21 tablet 0  . tiZANidine (ZANAFLEX) 4 MG tablet Take 1 tablet (4 mg total) by mouth every 6 (six) hours as needed for muscle spasms. 60 tablet 1   No current facility-administered medications on file prior to visit.     Past Surgical History:  Procedure Laterality Date  . BREAST LUMPECTOMY    . BREAST SURGERY    . CESAREAN SECTION    . TUBAL LIGATION      No Known Allergies  Social History   Socioeconomic History  . Marital status: Single    Spouse name: Not on file  . Number of children: Not on file  . Years of education: Not on file  . Highest education level: Not on file  Social Needs  . Financial resource strain: Not on file  . Food insecurity - worry: Not on file  . Food insecurity - inability: Not on file  . Transportation needs -  medical: Not on file  . Transportation needs - non-medical: Not on file  Occupational History  . Not on file  Tobacco Use  . Smoking status: Never Smoker  . Smokeless tobacco: Never Used  Substance and Sexual Activity  . Alcohol use: No  . Drug use: Not on file  . Sexual activity: No    Birth control/protection: None, Surgical  Other Topics Concern  . Not on file  Social History Narrative  . Not on file    History reviewed. No pertinent family history.  BP 121/86   Pulse 72   Ht 5\' 7"  (1.702 m)   Wt 225 lb (102.1 kg)   BMI 35.24 kg/m    Review of Systems: See HPI above.     Objective:  Physical Exam:  Gen: NAD, comfortable in exam room.  Back: No gross deformity, scoliosis. TTP mildly right lumbar paraspinal region.  No midline or bony TTP. FROM. Strength LEs 5/5 all muscle groups.   2+ MSRs in patellar and achilles tendons, equal bilaterally. Negative SLRs. Sensation intact to light touch bilaterally. Negative logroll bilateral hips   Assessment & Plan:  1. Lumbosacral radiculopathy - Prior MRI with evidence S1 nerve root compression, current pain consistent with this.  Much improved with prednisone dose pack, percocet, tizanidine.  To take ibuprofen or aleve if needed, tizanidine if needed now.  Reviewed home exercises to do daily.  F/u prn.

## 2017-11-04 ENCOUNTER — Ambulatory Visit: Payer: Medicaid Other | Admitting: Family Medicine

## 2018-07-27 IMAGING — MR MR LUMBAR SPINE W/O CM
5 series · 48 of 48 positions shown · non-contrast
Comparison: None.

CLINICAL DATA: Right leg numbness.

EXAM:
MRI LUMBAR SPINE WITHOUT CONTRAST
TECHNIQUE: Multiplanar, multisequence MR imaging of the lumbar spine was
performed. No intravenous contrast was administered.

[Series 2: T1 · sagittal · 4.0mm · 0.51mm/px · 5 of 13 slices shown (1 of 2)]
[im 1/13]
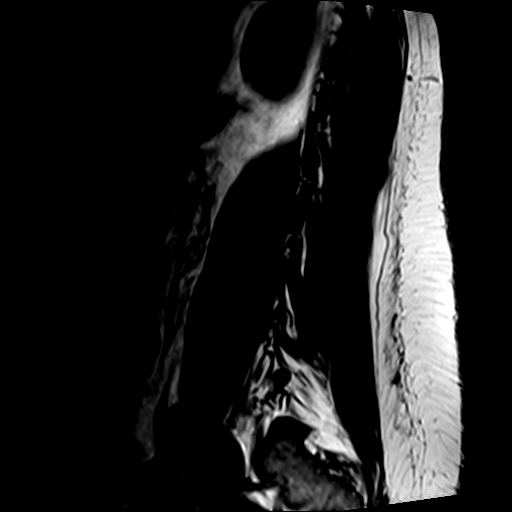
[im 4/13]
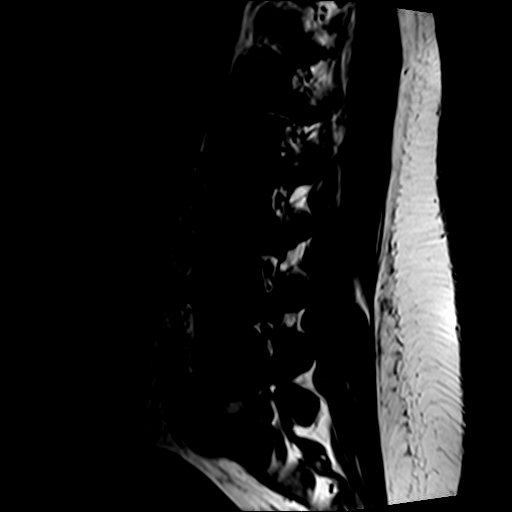
[im 7/13]
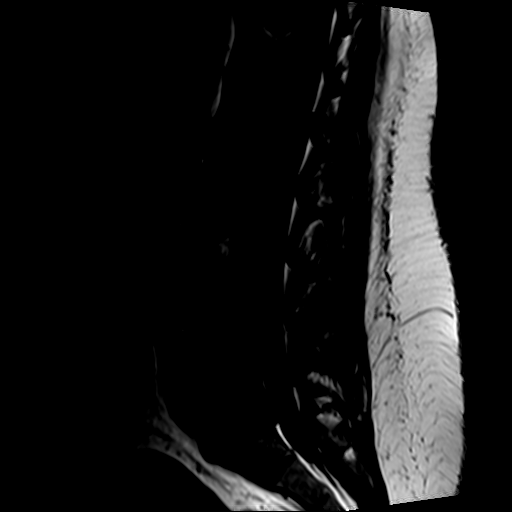
[im 10/13]
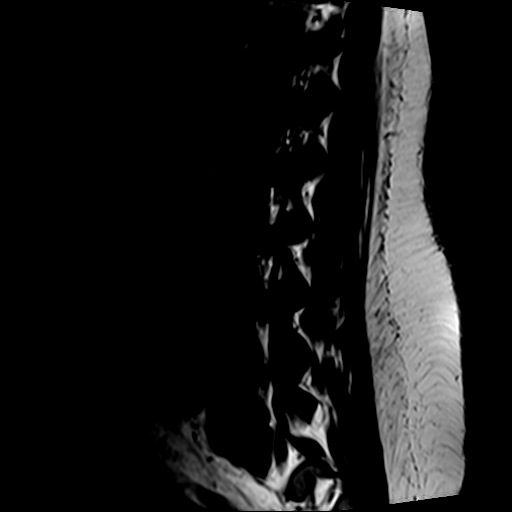
[im 13/13]
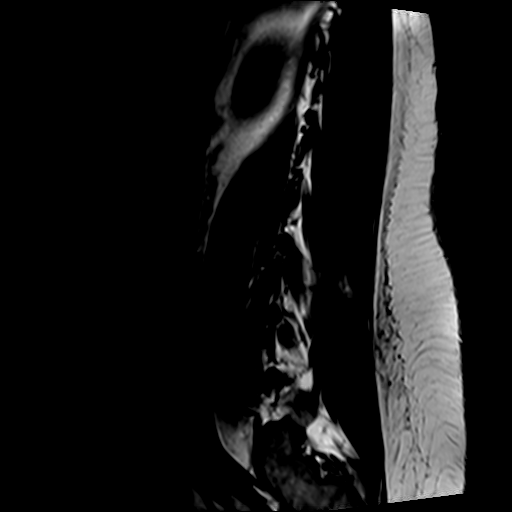

[Series 3: T2 · sagittal · 4.0mm · 0.81mm/px · 5 of 13 slices shown (1 of 2)]
[im 1/13]
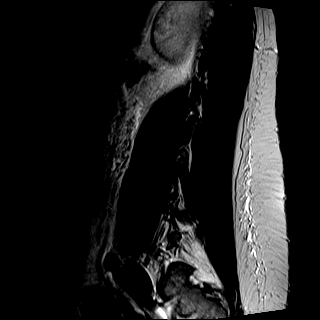
[im 4/13]
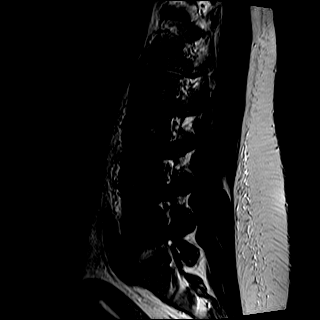
[im 7/13]
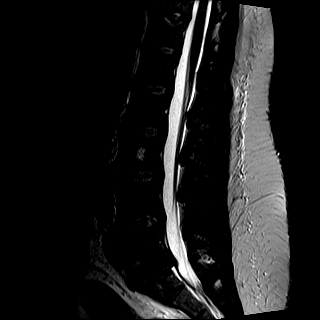
[im 10/13]
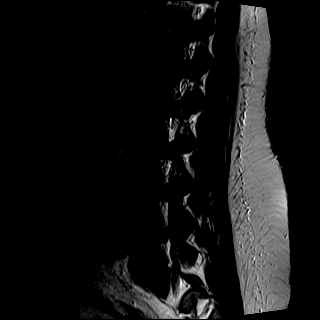
[im 13/13]
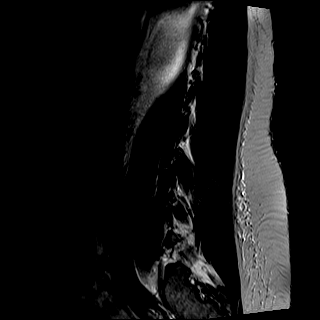

[Series 4: STIR · sagittal · 4.0mm · 1.02mm/px · 6 of 13 slices shown]
[im 1/13]
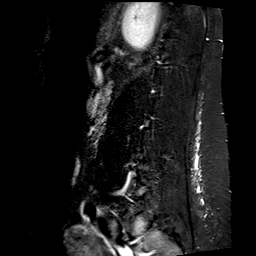
[im 3/13]
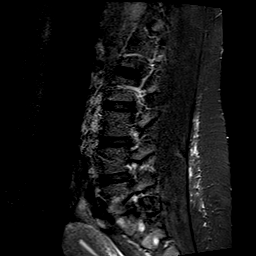
[im 5/13]
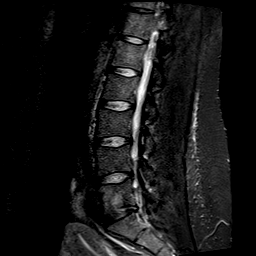
[im 8/13]
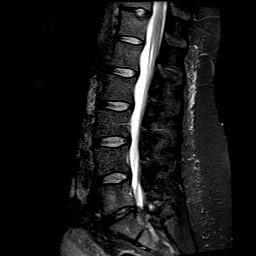
[im 10/13]
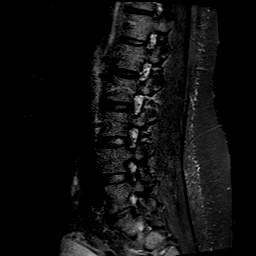
[im 13/13]
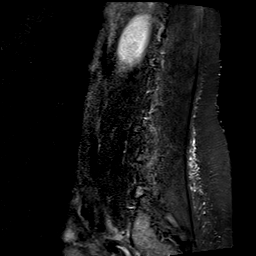

[Series 5: T2 · axial · 4.0mm · 0.78mm/px · z∈[-105,+97]mm · 16 of 37 slices shown (2 of 2)]
[im 1/37]
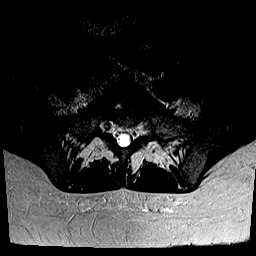
[im 3/37]
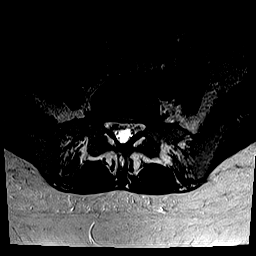
[im 5/37]
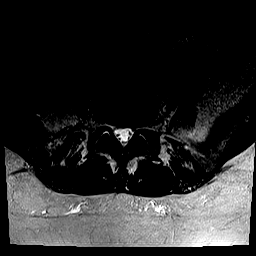
[im 8/37]
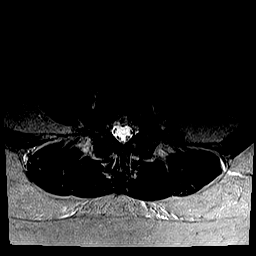
[im 10/37]
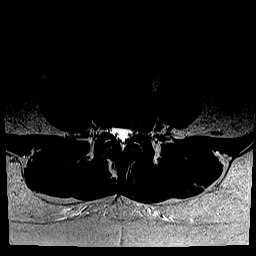
[im 13/37]
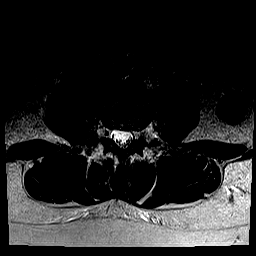
[im 15/37]
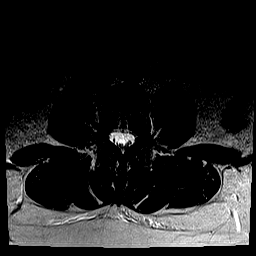
[im 17/37]
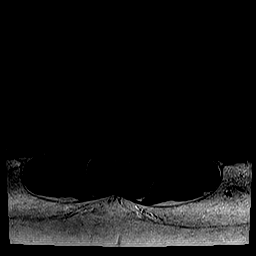
[im 20/37]
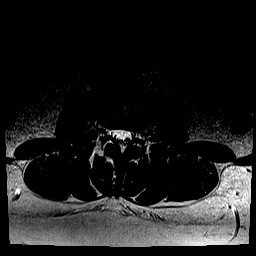
[im 22/37]
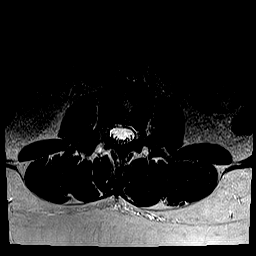
[im 25/37]
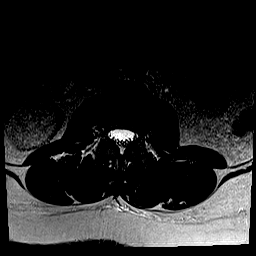
[im 27/37]
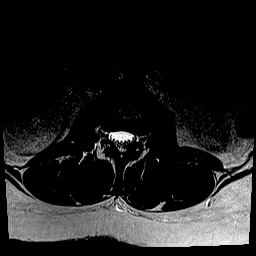
[im 29/37]
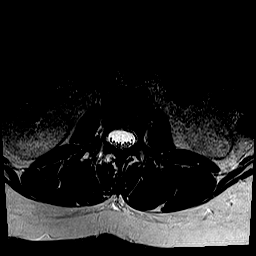
[im 32/37]
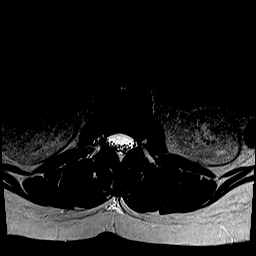
[im 34/37]
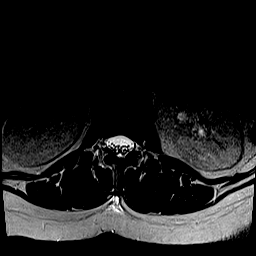
[im 37/37]
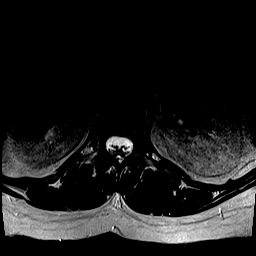

[Series 6: T1 · axial · 4.0mm · 0.78mm/px · z∈[-105,+97]mm · 16 of 37 slices shown (2 of 2)]
[im 1/37]
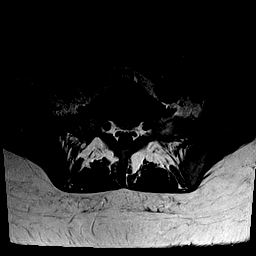
[im 3/37]
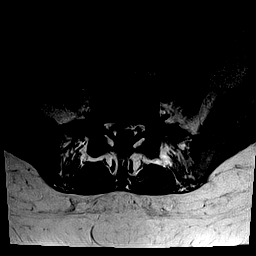
[im 5/37]
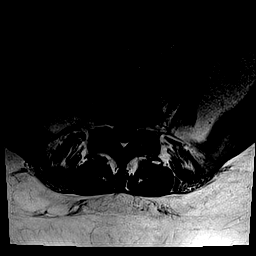
[im 8/37]
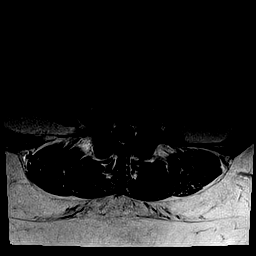
[im 10/37]
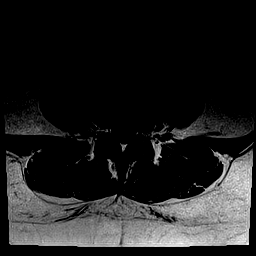
[im 13/37]
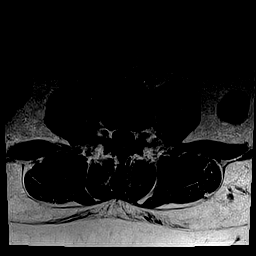
[im 15/37]
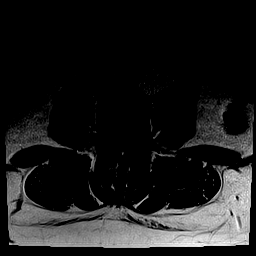
[im 17/37]
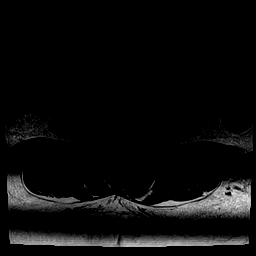
[im 20/37]
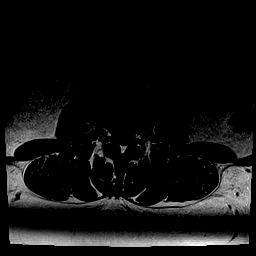
[im 22/37]
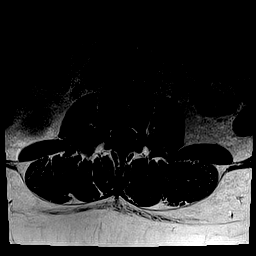
[im 25/37]
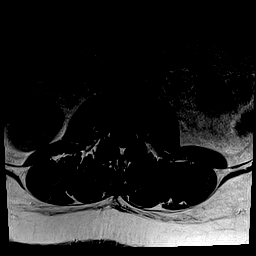
[im 27/37]
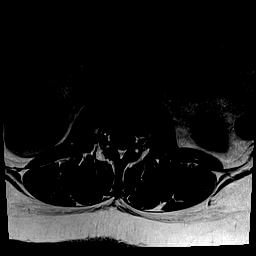
[im 29/37]
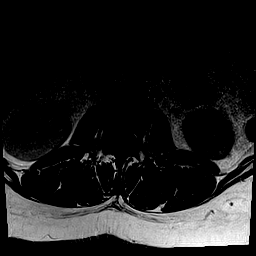
[im 32/37]
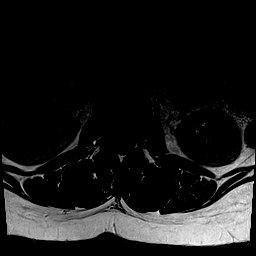
[im 34/37]
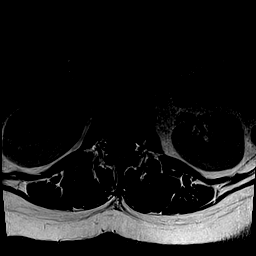
[im 37/37]
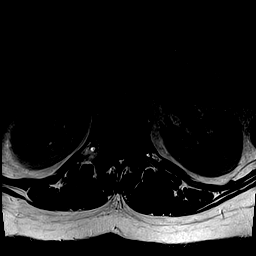

[48 of 48 positions shown; findings below may reference images not displayed]

FINDINGS: Segmentation:  Standard.

Alignment:  Slight retrolisthesis at L5-S1.

Vertebrae: No fracture, evidence of discitis, or bone lesion. Mild
degenerative fatty marrow conversion around the L5-S1 disc and the
T12 superior endplate Schmorl's node

Conus medullaris: Extends to the L1 level and appears normal.

Paraspinal and other soft tissues: Negative

Disc levels:

L4-5: Mild left foraminal disc bulging and endplate ridging but no
compression.

L5-S1: Disc narrowing and desiccation with right paracentral
protrusion compressing the S1 nerve.
IMPRESSION: L5-S1 right paracentral disc protrusion with S1 compression.

## 2020-10-08 ENCOUNTER — Ambulatory Visit: Payer: 59 | Admitting: Nurse Practitioner

## 2020-11-06 ENCOUNTER — Ambulatory Visit: Payer: 59 | Admitting: Nurse Practitioner

## 2020-11-22 ENCOUNTER — Encounter: Payer: Self-pay | Admitting: Nurse Practitioner

## 2020-11-22 ENCOUNTER — Telehealth: Payer: Self-pay | Admitting: Nurse Practitioner

## 2020-11-22 NOTE — Telephone Encounter (Signed)
Pt was no show for appt 11/06/20 new pt visit. 1st occurrence. Fee waived. Letter mailed.

## 2024-09-13 ENCOUNTER — Ambulatory Visit: Payer: Self-pay

## 2024-09-13 ENCOUNTER — Ambulatory Visit (INDEPENDENT_AMBULATORY_CARE_PROVIDER_SITE_OTHER)

## 2024-09-13 ENCOUNTER — Ambulatory Visit (HOSPITAL_BASED_OUTPATIENT_CLINIC_OR_DEPARTMENT_OTHER)
Admission: RE | Admit: 2024-09-13 | Discharge: 2024-09-13 | Disposition: A | Discharge status: Home / Self Care | Source: Ambulatory Visit

## 2024-09-13 VITALS — BP 130/80 | Ht 67.0 in | Wt 215.0 lb

## 2024-09-13 DIAGNOSIS — S060X0A Concussion without loss of consciousness, initial encounter: Secondary | ICD-10-CM

## 2024-09-13 DIAGNOSIS — M25511 Pain in right shoulder: Secondary | ICD-10-CM

## 2024-09-13 DIAGNOSIS — M25512 Pain in left shoulder: Secondary | ICD-10-CM | POA: Insufficient documentation

## 2024-09-13 DIAGNOSIS — M79601 Pain in right arm: Secondary | ICD-10-CM

## 2024-09-13 DIAGNOSIS — M542 Cervicalgia: Secondary | ICD-10-CM

## 2024-09-13 DIAGNOSIS — F430 Acute stress reaction: Secondary | ICD-10-CM | POA: Diagnosis not present

## 2024-09-13 MED ORDER — DICLOFENAC SODIUM 75 MG PO TBEC: 75.0000 mg | DELAYED_RELEASE_TABLET | Freq: Two times a day (BID) | ORAL | 0 refills | Status: AC
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
## 2024-09-14 ENCOUNTER — Ambulatory Visit: Payer: Self-pay

## 2024-09-14 NOTE — Addendum Note (Signed)
 Addended by: MARCINE HARLENE SAILOR on: 09/14/2024 01:03 PM   Modules accepted: Orders
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
## 2024-09-26 ENCOUNTER — Encounter: Payer: Self-pay | Admitting: Rehabilitation

## 2024-09-26 ENCOUNTER — Ambulatory Visit: Admitting: Rehabilitation

## 2024-09-26 ENCOUNTER — Other Ambulatory Visit: Payer: Self-pay

## 2024-09-26 DIAGNOSIS — M542 Cervicalgia: Secondary | ICD-10-CM

## 2024-09-26 DIAGNOSIS — M6281 Muscle weakness (generalized): Secondary | ICD-10-CM

## 2024-09-26 DIAGNOSIS — R293 Abnormal posture: Secondary | ICD-10-CM

## 2024-09-27 ENCOUNTER — Ambulatory Visit

## 2024-09-27 ENCOUNTER — Other Ambulatory Visit: Payer: Self-pay

## 2024-09-27 VITALS — BP 130/80 | Ht 67.0 in | Wt 215.0 lb

## 2024-09-27 DIAGNOSIS — M25511 Pain in right shoulder: Secondary | ICD-10-CM

## 2024-09-27 DIAGNOSIS — M542 Cervicalgia: Secondary | ICD-10-CM | POA: Diagnosis not present

## 2024-09-27 DIAGNOSIS — R55 Syncope and collapse: Secondary | ICD-10-CM | POA: Diagnosis not present

## 2024-09-27 DIAGNOSIS — M79601 Pain in right arm: Secondary | ICD-10-CM

## 2024-09-27 DIAGNOSIS — S060X0A Concussion without loss of consciousness, initial encounter: Secondary | ICD-10-CM | POA: Diagnosis present

## 2024-09-27 MED ORDER — METHYLPREDNISOLONE ACETATE 40 MG/ML IJ SUSP
40.0000 mg | Freq: Once | INTRAMUSCULAR | Status: AC
  Administered 2024-09-27: 40 mg via INTRA_ARTICULAR
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
## 2024-09-27 NOTE — Patient Instructions (Addendum)
 Concussion Eat healthy diet Prioritize sleep Reduce screen time and eye strain Exercise (stationary bike, walking, etc) for 15 minutes/day  Right shoulder pain with tiny avulsion fracture Wear the sling for the next 2 weeks Slide your arm out of the sling about every 1-2 hours and work on elbow mobility without moving the upper arm.  Neck pain Trigger point injection done today Keep up with home exercises continue by physical therapy Consider dry needling and physical therapy

## 2024-10-02 ENCOUNTER — Ambulatory Visit: Admitting: Rehabilitation

## 2024-10-04 ENCOUNTER — Ambulatory Visit

## 2024-10-09 ENCOUNTER — Ambulatory Visit

## 2024-10-11 ENCOUNTER — Ambulatory Visit

## 2024-10-12 ENCOUNTER — Ambulatory Visit: Admitting: Rehabilitation

## 2024-10-16 ENCOUNTER — Ambulatory Visit

## 2024-10-19 ENCOUNTER — Ambulatory Visit

## 2024-10-23 ENCOUNTER — Ambulatory Visit: Admitting: Rehabilitation

## 2024-10-26 ENCOUNTER — Ambulatory Visit

## 2024-10-30 ENCOUNTER — Ambulatory Visit: Admitting: Rehabilitation
